# Patient Record
Sex: Male | Born: 1965 | Race: Black or African American | Hispanic: No | Marital: Single | Smoking: Current every day smoker
Health system: Southern US, Community
[De-identification: ages and names within clinical notes are randomized; demographics above are authoritative.]

---

## 2014-08-03 ENCOUNTER — Emergency Department (HOSPITAL_COMMUNITY): Payer: Self-pay

## 2014-08-03 ENCOUNTER — Encounter (HOSPITAL_COMMUNITY): Payer: Self-pay | Admitting: Emergency Medicine

## 2014-08-03 ENCOUNTER — Emergency Department (HOSPITAL_COMMUNITY)
Admission: EM | Admit: 2014-08-03 | Discharge: 2014-08-03 | Disposition: A | Discharge status: Home / Self Care | Payer: Self-pay | Attending: Emergency Medicine | Admitting: Emergency Medicine

## 2014-08-03 DIAGNOSIS — H1132 Conjunctival hemorrhage, left eye: Secondary | ICD-10-CM | POA: Insufficient documentation

## 2014-08-03 DIAGNOSIS — S028XXA Fractures of other specified skull and facial bones, initial encounter for closed fracture: Secondary | ICD-10-CM | POA: Insufficient documentation

## 2014-08-03 DIAGNOSIS — S60512A Abrasion of left hand, initial encounter: Secondary | ICD-10-CM | POA: Insufficient documentation

## 2014-08-03 DIAGNOSIS — S0512XA Contusion of eyeball and orbital tissues, left eye, initial encounter: Secondary | ICD-10-CM

## 2014-08-03 DIAGNOSIS — Y9355 Activity, bike riding: Secondary | ICD-10-CM | POA: Insufficient documentation

## 2014-08-03 DIAGNOSIS — S022XXA Fracture of nasal bones, initial encounter for closed fracture: Secondary | ICD-10-CM | POA: Insufficient documentation

## 2014-08-03 DIAGNOSIS — Y92410 Unspecified street and highway as the place of occurrence of the external cause: Secondary | ICD-10-CM | POA: Insufficient documentation

## 2014-08-03 DIAGNOSIS — S0012XA Contusion of left eyelid and periocular area, initial encounter: Secondary | ICD-10-CM | POA: Insufficient documentation

## 2014-08-03 DIAGNOSIS — S80211A Abrasion, right knee, initial encounter: Secondary | ICD-10-CM | POA: Insufficient documentation

## 2014-08-03 DIAGNOSIS — Y998 Other external cause status: Secondary | ICD-10-CM | POA: Insufficient documentation

## 2014-08-03 DIAGNOSIS — S60511A Abrasion of right hand, initial encounter: Secondary | ICD-10-CM | POA: Insufficient documentation

## 2014-08-03 DIAGNOSIS — Z720 Tobacco use: Secondary | ICD-10-CM | POA: Insufficient documentation

## 2014-08-03 DIAGNOSIS — T07XXXA Unspecified multiple injuries, initial encounter: Secondary | ICD-10-CM

## 2014-08-03 DIAGNOSIS — S0081XA Abrasion of other part of head, initial encounter: Secondary | ICD-10-CM | POA: Insufficient documentation

## 2014-08-03 DIAGNOSIS — Z23 Encounter for immunization: Secondary | ICD-10-CM | POA: Insufficient documentation

## 2014-08-03 DIAGNOSIS — S40212A Abrasion of left shoulder, initial encounter: Secondary | ICD-10-CM | POA: Insufficient documentation

## 2014-08-03 DIAGNOSIS — S0285XA Fracture of orbit, unspecified, initial encounter for closed fracture: Secondary | ICD-10-CM

## 2014-08-03 DIAGNOSIS — S80212A Abrasion, left knee, initial encounter: Secondary | ICD-10-CM | POA: Insufficient documentation

## 2014-08-03 LAB — COMPREHENSIVE METABOLIC PANEL
ALBUMIN: 3.9 g/dL (ref 3.5–5.0)
ALT: 35 U/L (ref 17–63)
ANION GAP: 6 (ref 5–15)
AST: 48 U/L — ABNORMAL HIGH (ref 15–41)
Alkaline Phosphatase: 59 U/L (ref 38–126)
BILIRUBIN TOTAL: 1 mg/dL (ref 0.3–1.2)
BUN: 20 mg/dL (ref 6–20)
CALCIUM: 8.6 mg/dL — AB (ref 8.9–10.3)
CO2: 27 mmol/L (ref 22–32)
Chloride: 104 mmol/L (ref 101–111)
Creatinine, Ser: 0.86 mg/dL (ref 0.61–1.24)
GFR calc Af Amer: >60 mL/min (ref >60)
GFR calc non Af Amer: >60 mL/min (ref >60)
Glucose, Bld: 158 mg/dL — ABNORMAL HIGH (ref 65–99)
Potassium: 3.6 mmol/L (ref 3.5–5.1)
Sodium: 137 mmol/L (ref 135–145)
TOTAL PROTEIN: 6.7 g/dL (ref 6.5–8.1)

## 2014-08-03 LAB — CBC WITH DIFFERENTIAL/PLATELET
Basophils Absolute: 0 10*3/uL (ref 0.0–0.1)
Basophils Relative: 0 % (ref 0–1)
Eosinophils Absolute: 0.2 10*3/uL (ref 0.0–0.7)
Eosinophils Relative: 2 % (ref 0–5)
HCT: 39.4 % (ref 39.0–52.0)
HEMOGLOBIN: 13.1 g/dL (ref 13.0–17.0)
LYMPHS ABS: 2.9 10*3/uL (ref 0.7–4.0)
Lymphocytes Relative: 29 % (ref 12–46)
MCH: 32.9 pg (ref 26.0–34.0)
MCHC: 33.2 g/dL (ref 30.0–36.0)
MCV: 99 fL (ref 78.0–100.0)
Monocytes Absolute: 0.9 10*3/uL (ref 0.1–1.0)
Monocytes Relative: 10 % (ref 3–12)
NEUTROS ABS: 5.8 10*3/uL (ref 1.7–7.7)
Neutrophils Relative %: 59 % (ref 43–77)
PLATELETS: 223 10*3/uL (ref 150–400)
RBC: 3.98 MIL/uL — ABNORMAL LOW (ref 4.22–5.81)
RDW: 12.5 % (ref 11.5–15.5)
WBC: 9.8 10*3/uL (ref 4.0–10.5)

## 2014-08-03 MED ORDER — OXYCODONE-ACETAMINOPHEN 5-325 MG PO TABS: 1.0000 | ORAL_TABLET | Freq: Four times a day (QID) | ORAL | Status: AC | PRN

## 2014-08-03 MED ORDER — TETANUS-DIPHTH-ACELL PERTUSSIS 5-2.5-18.5 LF-MCG/0.5 IM SUSP
0.5000 mL | Freq: Once | INTRAMUSCULAR | Status: AC
  Administered 2014-08-03: 0.5 mL via INTRAMUSCULAR
  Filled 2014-08-03: qty 0.5

## 2014-08-03 MED ORDER — TETRACAINE HCL 0.5 % OP SOLN
2.0000 [drp] | Freq: Once | OPHTHALMIC | Status: DC
Start: 2014-08-03 — End: 2014-08-03
  Filled 2014-08-03: qty 2

## 2014-08-03 MED ORDER — FLUORESCEIN SODIUM 1 MG OP STRP
1.0000 | ORAL_STRIP | Freq: Once | OPHTHALMIC | Status: DC
  Filled 2014-08-03: qty 1

## 2014-08-03 NOTE — ED Notes (Signed)
Eye box at bedside 

## 2014-08-03 NOTE — ED Notes (Signed)
Patient transported to CT 

## 2014-08-03 NOTE — ED Notes (Addendum)
Fluorescein strip at bedside, tono pen at bedside, tetracaine at bedside.

## 2014-08-03 NOTE — ED Notes (Signed)
Patient resting, in no acute distress.  Advised patient he may NOT go outside to smoke.  He agrees to comply with no smoking policy.
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
## 2014-08-03 NOTE — ED Notes (Signed)
Delay on lab draw, pt currently in exray 
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
